# Patient Record
Sex: Male | Born: 1990 | Race: Black or African American | Hispanic: No | Marital: Single | Smoking: Never smoker
Health system: Southern US, Community
[De-identification: ages and names within clinical notes are randomized; demographics above are authoritative.]

## PROBLEM LIST (undated history)

## (undated) DIAGNOSIS — I499 Cardiac arrhythmia, unspecified: Secondary | ICD-10-CM

## (undated) DIAGNOSIS — T7840XA Allergy, unspecified, initial encounter: Secondary | ICD-10-CM

## (undated) HISTORY — DX: Allergy, unspecified, initial encounter: T78.40XA

---

## 2012-03-13 ENCOUNTER — Encounter (HOSPITAL_COMMUNITY): Payer: Self-pay | Admitting: *Deleted

## 2012-03-13 ENCOUNTER — Encounter (HOSPITAL_COMMUNITY): Payer: Self-pay

## 2012-03-13 ENCOUNTER — Emergency Department (HOSPITAL_COMMUNITY)
Admission: EM | Admit: 2012-03-13 | Discharge: 2012-03-13 | Disposition: A | Discharge status: Other Acute Inpatient Hospital | Payer: BC Managed Care – PPO | Source: Home / Self Care | Attending: Emergency Medicine | Admitting: Emergency Medicine

## 2012-03-13 ENCOUNTER — Emergency Department (HOSPITAL_COMMUNITY)
Admission: EM | Admit: 2012-03-13 | Discharge: 2012-03-13 | Disposition: A | Discharge status: Home / Self Care | Payer: BC Managed Care – PPO | Attending: Emergency Medicine | Admitting: Emergency Medicine

## 2012-03-13 ENCOUNTER — Emergency Department (INDEPENDENT_AMBULATORY_CARE_PROVIDER_SITE_OTHER): Payer: BC Managed Care – PPO

## 2012-03-13 DIAGNOSIS — R079 Chest pain, unspecified: Secondary | ICD-10-CM

## 2012-03-13 DIAGNOSIS — I4949 Other premature depolarization: Secondary | ICD-10-CM

## 2012-03-13 DIAGNOSIS — I493 Ventricular premature depolarization: Secondary | ICD-10-CM

## 2012-03-13 HISTORY — DX: Cardiac arrhythmia, unspecified: I49.9

## 2012-03-13 MED ORDER — SODIUM CHLORIDE 0.9 % IV SOLN
Freq: Once | INTRAVENOUS | Status: AC
  Administered 2012-03-13 (×2): via INTRAVENOUS

## 2012-03-13 NOTE — ED Provider Notes (Addendum)
History     CSN: [id number]  Arrival date & time 03/13/12  1619   First MD Initiated Contact with Patient 03/13/12 1620      Chief Complaint  Patient presents with  . Heartburn    (Consider location/radiation/quality/duration/timing/severity/associated sxs/prior treatment) HPI Comments: Patient presents urgent care this evening complaining of an ongoing left-sided chest pain that started last night. He describes pain is somewhat constant but exacerbates with movements. He also seems to describe some irregular heartbeats. Patient is squeezing type, comes and goes, denies any shortness of breath, cough, denies feeling nauseous. Describes that he vomited once. Patient also describes that yesterday evening he had some chicken, and thought that perhaps he was having some reflux. Denies any epigastric discomfort.  Patient is a 21 y.o. male presenting with heartburn. The history is provided by the patient.  Heartburn This is a new problem. The current episode started 12 to 24 hours ago. The problem occurs constantly. The problem has not changed since onset.Associated symptoms include chest pain. Pertinent negatives include no abdominal pain, no headaches and no shortness of breath. The symptoms are aggravated by walking. Nothing relieves the symptoms. Treatments tried: Tums. The treatment provided no relief.    Past Medical History  Diagnosis Date  . Irregular heart rate     History reviewed. No pertinent past surgical history.  Family History  Problem Relation Age of Onset  . Heart disease Brother     History  Substance Use Topics  . Smoking status: Never Smoker   . Smokeless tobacco: Not on file  . Alcohol Use: Yes     socially      Review of Systems  Constitutional: Negative for activity change and appetite change.  Respiratory: Negative for apnea, cough and shortness of breath.   Cardiovascular: Positive for chest pain. Negative for palpitations and leg swelling.    Gastrointestinal: Positive for heartburn. Negative for abdominal pain.  Skin: Negative for rash.  Neurological: Negative for dizziness, light-headedness, numbness and headaches.    Allergies  Review of patient's allergies indicates no known allergies.  Home Medications  No current outpatient prescriptions on file.  BP 124/53  Pulse 45  Temp 98.9 F (37.2 C) (Oral)  Resp 16  SpO2 100%  Physical Exam  Nursing note and vitals reviewed. Constitutional: He appears well-developed and well-nourished.  Neck: Neck supple. No JVD present.  Cardiovascular: Normal pulses.  An irregular rhythm present. Bradycardia present.  Exam reveals friction rub. Exam reveals no gallop.   No murmur heard. Pulmonary/Chest: Effort normal and breath sounds normal.  Neurological: He is alert.  Skin: No rash noted. No erythema.    ED Course  Procedures (including critical care time)  Labs Reviewed - No data to display Dg Chest 2 View  03/13/2012  *RADIOLOGY REPORT*  Clinical Data: Left-sided chest pain  CHEST - 2 VIEW  Comparison:  None.  Findings:  The heart size and mediastinal contours are within normal limits.  Both lungs are clear.  The visualized skeletal structures are unremarkable.  IMPRESSION: No active cardiopulmonary disease.  Original Report Authenticated By: Camelia Phenes, M.D.     No diagnosis found.  EKG at [facility] around 17 hours showed sinus bradycardia with occasional PVCs and incomplete right bundle branch block. No abnormal QT or PR intervals. No ST depression or elevation. No Q waves  MDM  Patient symptomatic describing left-sided chest pain for the last 24 hours. Patient has no respiratory symptoms and has a normal chest x-ray and  normal respiratory exam. Patient has some occasional PVCs and a incomplete right bundle blanch block. Given patient's current symptomatology and abnormal EKG have decided to transfer patient for further monitoring and evaluation in the emergency  department.        Jimmie Molly, MD 03/13/12 Flossie Buffy  Jimmie Molly, MD 03/13/12 2000

## 2012-03-13 NOTE — ED Notes (Signed)
Pt started having cp, last night after dinner continued to this morning, went to urgent care and then referred here.

## 2012-03-13 NOTE — ED Notes (Signed)
"   i have this heartburn on the left side of my chest since last night" pt denies radiating pain, shortness of breath or nausea. unrelived with "tums"

## 2012-03-13 NOTE — ED Notes (Signed)
Care transferred to [facility]. Mother at bedside.

## 2012-03-13 NOTE — ED Notes (Signed)
ekg given to MD per EDT

## 2012-03-13 NOTE — ED Notes (Signed)
Report called to charge nurse [facility]. [facility] called to transport.

## 2012-03-13 NOTE — ED Provider Notes (Signed)
History     CSN: [id number]  Arrival date & time 03/13/12  4098   First MD Initiated Contact with Patient 03/13/12 1904      Chief Complaint  Patient presents with  . Chest Pain    (Consider location/radiation/quality/duration/timing/severity/associated sxs/prior treatment) Patient is a 21 y.o. male presenting with chest pain. The history is provided by the patient and a parent.  Chest Pain The chest pain began yesterday. Chest pain occurs intermittently. The chest pain is unchanged. The pain is associated with lifting (twisting, sitting up). The severity of the pain is mild. Quality: cramping. The pain does not radiate. Exacerbated by: twisting, sitting up. Pertinent negatives for primary symptoms include no fever, no fatigue, no syncope, no shortness of breath, no cough, no wheezing, no palpitations, no abdominal pain, no nausea, no vomiting, no dizziness and no altered mental status.  Pertinent negatives for associated symptoms include no claudication, no lower extremity edema, no near-syncope, no orthopnea, no paroxysmal nocturnal dyspnea and no weakness. He tried nothing for the symptoms. Risk factors include no known risk factors.  His family medical history is significant for CAD in family.     Past Medical History  Diagnosis Date  . Irregular heart rate     History reviewed. No pertinent past surgical history.  Family History  Problem Relation Age of Onset  . Heart disease Brother     History  Substance Use Topics  . Smoking status: Never Smoker   . Smokeless tobacco: Not on file  . Alcohol Use: Yes     socially      Review of Systems  Constitutional: Negative for fever and fatigue.  HENT: Negative.   Eyes: Negative.   Respiratory: Negative for cough, chest tightness, shortness of breath and wheezing.   Cardiovascular: Positive for chest pain. Negative for palpitations, orthopnea, claudication, leg swelling, syncope and near-syncope.  Gastrointestinal:  Negative.  Negative for nausea, vomiting and abdominal pain.  Genitourinary: Negative.   Musculoskeletal: Negative.   Skin: Negative.   Neurological: Negative for dizziness, syncope, facial asymmetry, weakness and light-headedness.  Psychiatric/Behavioral: Negative for altered mental status.  All other systems reviewed and are negative.    Allergies  Review of patient's allergies indicates no known allergies.  Home Medications  No current outpatient prescriptions on file.  BP 103/67  Pulse 61  Temp 98.6 F (37 C) (Oral)  Resp 18  Ht 6\' 3"  (1.905 m)  Wt 200 lb (90.719 kg)  BMI 25.00 kg/m2  SpO2 100%  Physical Exam  Nursing note and vitals reviewed. Constitutional: He is oriented to person, place, and time. He appears well-developed and well-nourished. No distress.  HENT:  Head: Normocephalic and atraumatic.  Eyes: Conjunctivae are normal.  Neck: Neck supple.  Cardiovascular: Normal rate, regular rhythm, normal heart sounds and intact distal pulses.  Exam reveals no friction rub.   No murmur heard. Pulmonary/Chest: Effort normal and breath sounds normal. He has no wheezes. He has no rales.  Abdominal: Soft. He exhibits no distension. There is no tenderness.  Musculoskeletal: Normal range of motion.  Neurological: He is alert and oriented to person, place, and time.  Skin: Skin is warm and dry.    ED Course  Procedures (including critical care time)  Labs Reviewed - No data to display Dg Chest 2 View  03/13/2012  *RADIOLOGY REPORT*  Clinical Data: Left-sided chest pain  CHEST - 2 VIEW  Comparison:  None.  Findings:  The heart size and mediastinal contours are within normal  limits.  Both lungs are clear.  The visualized skeletal structures are unremarkable.  IMPRESSION: No active cardiopulmonary disease.  Original Report Authenticated By: Camelia Phenes, M.D.     1. Chest pain       MDM  21 yo male with no PMHx who presents with intermittent cramping left lower  chest pain that started yesterday.  Pt was seen at urgent care today where he had and EKG and CXR and instructed to come to the ED.  Description of chest pain atypical.  Pain made worse with twisting and sitting up from a lying position.  No exertional chest pain.  No shortness of breath, pleuritic component, orthopnea, or PND.  PERC negative and I have very low suspicion for PE.  AF, nml vital signs, NAD.  Physical exam with normal cardiac and pulmonary exams.  No murmurs or rubs, pain not worse with lying flat.  Doubt pericarditis.  EKG NSR w/o signs of arrhythmia, ischemia, or infarction.  CXR reviewed and showed no acute abnormality.  Pt pain free at time of exam.  No signs of ACS.  Will DC home with outpatient cardiology referral.  Tx plan discussed with pt and parent who voiced understanding.  Strong return precautions provided.        Cherre Robins, MD 03/13/12 2326

## 2012-03-14 ENCOUNTER — Telehealth (HOSPITAL_COMMUNITY): Payer: Self-pay | Admitting: *Deleted

## 2012-03-14 DIAGNOSIS — R079 Chest pain, unspecified: Secondary | ICD-10-CM

## 2012-03-14 DIAGNOSIS — R0602 Shortness of breath: Secondary | ICD-10-CM

## 2012-03-14 NOTE — Telephone Encounter (Signed)
Pt scheduled for thur 8/15 2 pm echo and OV at 3, his mother is aware

## 2012-03-14 NOTE — ED Provider Notes (Signed)
I saw and evaluated the patient, reviewed Dr. Daleen Bo note and I agree with the findings and plan.  The patient presents complaining of chest discomfort since yesterday.  This started after he sat up suddenly.  The pain is sharp in nature and is worse with deep breath and position.  There is no shortness of breath and no exertional component.  He is quite athletic and works out regularly without symptoms.  He was seen at urgent care and told to come here to be looked at further.  There was the question of a rub to auscultation of the heart.  On exam, the heart is rrr without murmurs or rubs.  The lungs are clear.  The abdomen is soft and non-tender.  The ekg and chest xray are unremarkable.  I am unable to appreciate a rub or other abnormalities and the symptoms are consistent with a musculoskeletal etiology.  After discussion with the mother and patient, the patient will be discharged with follow up with cardiology to discuss an echocardiogram.  He will return if his symptoms worsen or change.    Geoffery Lyons, MD 03/14/12 [phone]

## 2012-03-14 NOTE — Telephone Encounter (Signed)
Per Dr Gala Romney pt was seen in ER yesterday with CP and had changes on EKG, needs to be seen this week with an echo.  Order placed and will schedule and contact pt's mother for appt

## 2012-03-16 ENCOUNTER — Ambulatory Visit (HOSPITAL_BASED_OUTPATIENT_CLINIC_OR_DEPARTMENT_OTHER)
Admission: RE | Admit: 2012-03-16 | Discharge: 2012-03-16 | Disposition: A | Discharge status: Home / Self Care | Payer: BC Managed Care – PPO | Source: Ambulatory Visit | Attending: Internal Medicine | Admitting: Internal Medicine

## 2012-03-16 ENCOUNTER — Ambulatory Visit (HOSPITAL_COMMUNITY)
Admission: RE | Admit: 2012-03-16 | Discharge: 2012-03-16 | Disposition: A | Discharge status: Home / Self Care | Payer: BC Managed Care – PPO | Source: Ambulatory Visit | Attending: Internal Medicine | Admitting: Internal Medicine

## 2012-03-16 ENCOUNTER — Encounter (HOSPITAL_COMMUNITY): Payer: Self-pay

## 2012-03-16 VITALS — BP 116/78 | HR 78 | Ht 75.0 in | Wt 180.0 lb

## 2012-03-16 DIAGNOSIS — R079 Chest pain, unspecified: Secondary | ICD-10-CM | POA: Insufficient documentation

## 2012-03-16 DIAGNOSIS — R0789 Other chest pain: Secondary | ICD-10-CM

## 2012-03-16 DIAGNOSIS — R072 Precordial pain: Secondary | ICD-10-CM

## 2012-03-16 DIAGNOSIS — I379 Nonrheumatic pulmonary valve disorder, unspecified: Secondary | ICD-10-CM | POA: Insufficient documentation

## 2012-03-16 NOTE — Progress Notes (Signed)
*  PRELIMINARY RESULTS* Echocardiogram 2D Echocardiogram has been performed.  Jeryl Columbia 03/16/2012, 2:42 PM

## 2012-03-16 NOTE — Progress Notes (Signed)
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 Hillary Bow for evaluation of CP.   He has a h/o migraines but no other significant PMHx. Was very active in school playing football, basketball and other sports. When he was playing basketball had some dizziness. Saw Dr. Rosiland Oz [facility] ped cardiology (EP) in 2010. Did an echo and wore a Holter and did a stress test. Everything was normal except for some PVCs.   Continues to go to gym and run without any problems.   On Monday night was eating chicken and got severe crampy pain. No diaphoresis, n/v. Tried to sleep it off but when he woke up pain was there. Worse when moving or lifting arms up to take shirt off. Went to UC. ECG showed sinus brady 59. +prominent volts. No ST-T wave abnormalities. No bloodwork drawn. ER doc though he may have heard a rub.   Echo today which I reviewed. EF 50-55%.Normal thickness. Normal RV. Valves normal. No effusion.    Review of Systems:     Cardiac Review of Systems: {Y] = yes [ ]  = no  Chest Pain [  y  ]  Resting SOB [   ] Exertional SOB  [  ]  Orthopnea [  ]   Pedal Edema [   ]    Palpitations [  ] Syncope  [  ]   Presyncope [   ]  General Review of Systems: [Y] = yes [  ]=no Constitional: recent weight change [  ]; anorexia [  ]; fatigue [  ]; nausea [  ]; night sweats [  ]; fever [  ]; or chills [  ];                                                                                                                                          Dental: poor dentition[  ]  Eye : blurred vision [  ]; diplopia [   ]; vision changes [  ];  Amaurosis fugax[  ]; Resp: cough [  ];  wheezing[  ];  hemoptysis[  ]; shortness of breath[  ]; paroxysmal nocturnal dyspnea[  ]; dyspnea on exertion[  ]; or orthopnea[  ];  GI:  gallstones[  ], vomiting[  ];  dysphagia[  ]; melena[  ];  hematochezia [  ]; heartburn[  ];   Hx of  Colonoscopy[  ]; GU: kidney stones [  ]; hematuria[  ];   dysuria [   ];  nocturia[  ];  history of     obstruction [  ];                 Skin: rash, swelling[  ];, hair loss[  ];  peripheral edema[  ];  or itching[  ]; Musculosketetal: myalgias[  ];  joint swelling[  ];  joint erythema[  ];  joint pain[  ];  back pain[  ];  Heme/Lymph: bruising[  ];  bleeding[  ];  anemia[  ];  Neuro: TIA[  ];  headaches[  ];  stroke[  ];  vertigo[  ];  seizures[  ];   paresthesias[  ];  difficulty walking[  ];  Psych:depression[  ]; anxiety[  ];  Endocrine: diabetes[  ];  thyroid dysfunction[  ];    Past Medical History  Diagnosis Date  . Irregular heart rate     No current outpatient prescriptions on file.     No Known Allergies  History   Social History  . Marital Status: Single    Spouse Name: N/A    Number of Children: N/A  . Years of Education: N/A   Occupational History  . Not on file.   Social History Main Topics  . Smoking status: Never Smoker   . Smokeless tobacco: Not on file  . Alcohol Use: Yes     socially  . Drug Use: No  . Sexually Active: No   Other Topics Concern  . Not on file   Social History Narrative  . No narrative on file    Family History  Problem Relation Age of Onset  . Heart disease Brother     PHYSICAL EXAM: Filed Vitals:   03/16/12 1515  BP: 116/78  Pulse: 78   General:  Well appearing, muscular. No respiratory difficulty HEENT: normal Neck: supple. no JVD. Carotids 2+ bilat; no bruits. No lymphadenopathy or thryomegaly appreciated. Cor: PMI nondisplaced. Regular rate & rhythm. No rubs, gallops or murmurs. Tender to palpation. Lungs: clear Abdomen: soft, nontender, nondistended. No hepatosplenomegaly. No bruits or masses. Good bowel sounds. Extremities: no cyanosis, clubbing, rash, edema Neuro: alert & oriented x 3, cranial nerves grossly intact. moves all 4 extremities w/o difficulty. Affect pleasant.  ECG: sinus brady 59. +prominent volts. No ST-T wave abnormalities.    ASSESSMENT & PLAN:

## 2012-04-01 DIAGNOSIS — R0789 Other chest pain: Secondary | ICD-10-CM | POA: Insufficient documentation

## 2012-04-01 NOTE — Assessment & Plan Note (Addendum)
Symptoms quite atypical. Exercise capacity preserved. CP likely musculoskeletal. Echo reviewed personally. I reassured Stephenson and his mother that I thought his CP was musculoskeletal and no further work-up needed at this point. Can f/u with me as needed. Contact me ASAP if CP recurs or develops exertional symptoms.

## 2016-03-26 ENCOUNTER — Other Ambulatory Visit: Payer: Self-pay | Admitting: Family

## 2016-03-26 DIAGNOSIS — R2242 Localized swelling, mass and lump, left lower limb: Secondary | ICD-10-CM

## 2016-04-04 ENCOUNTER — Other Ambulatory Visit: Payer: Self-pay

## 2016-05-26 ENCOUNTER — Ambulatory Visit: Payer: Self-pay | Admitting: Neurology

## 2016-10-06 ENCOUNTER — Ambulatory Visit (INDEPENDENT_AMBULATORY_CARE_PROVIDER_SITE_OTHER): Payer: Commercial Managed Care - PPO | Admitting: Orthopedic Surgery

## 2016-10-06 ENCOUNTER — Encounter (INDEPENDENT_AMBULATORY_CARE_PROVIDER_SITE_OTHER): Payer: Self-pay | Admitting: Orthopedic Surgery

## 2016-10-06 DIAGNOSIS — M25552 Pain in left hip: Secondary | ICD-10-CM | POA: Diagnosis not present

## 2016-10-06 MED ORDER — DICLOFENAC SODIUM 75 MG PO TBEC: DELAYED_RELEASE_TABLET | ORAL | 1 refills | Status: DC

## 2016-10-06 NOTE — Progress Notes (Signed)
Office Visit Note   Patient: Michael Bennett           Date of Birth: Jul 27, 1991           MRN: [id number] Visit Date: 10/06/2016 Requested by: No referring provider defined for this encounter. PCP: Default, Provider, MD  Subjective: Chief Complaint  Patient presents with  . 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  He has had radiographs done on 2 occasions of the hip which were interpreted as being normal.  He's had some adjustments by Dr. Manson Passey which have helped to relieve some of the malalignment in his hip as well as some of the numbness that he had in his leg but it is not gone and he reports continued symptoms.  He localizes a constant pain to the groin anteriorly on the left-hand side.  Pain comes and goes he reports stiffness and tightness in this area.  The groin pain doesn't really go away.  This all started a year ago with some type of leg press hyperflexion injury of the hip joint.  He has not had a course of sustained anti-inflammatories yet for the problem.              ROS: All systems reviewed are negative as they relate to the chief complaint within the history of present illness.  Patient denies  fevers or chills.   Assessment & Plan: Visit Diagnoses:  1. Left hip pain     Plan: Impression is left hip pain of unclear etiology.  I discussed with him the several different possibilities that it could be.  One would be an occult arthritis which could account for some of the stiffness he is having.  Alternatively he may have early femoroacetabular impingement but that should've been noticed on the 2 sets of plain radiographs which I do not have access to.  Alternatively this could be a labral tear of the hip.  A hernia is also in the differential diagnosis but again unlikely based on his recent weightlifting set [place] done with squats and lunges.  I think it's  also possible this could be referred pain from a disc in his back although his exam today is pretty normal.  Plan at this time is to put him on a tapered dose of an anti-inflammatory for 4 weeks.  See him back in 6 weeks and then decide then for or against a left hip injection  Follow-Up Instructions: Return in about 6 weeks (around 11/17/2016).   Orders:  No orders of the defined types were placed in this encounter.  No orders of the defined types were placed in this encounter.     Procedures: No procedures performed   Clinical Data: No additional findings.  Objective: Vital Signs: There were no vitals taken for this visit.  Physical Exam:   Constitutional: Patient appears well-developed HEENT:  Head: Normocephalic Eyes:EOM are normal Neck: Normal range of motion Cardiovascular: Normal rate Pulmonary/chest: Effort normal Neurologic: Patient is alert Skin: Skin is warm Psychiatric: Patient has normal mood and affect    Ortho Exam: Orthopedic exam demonstrates normal gait and alignment excellent muscle tone in the legs no atrophy noted no nerve retention signs on either side.  No real groin pain with internal/external rotation of either leg excellent abduction and adduction and hip flexion quite hamstring strength.  I will detect any  clicking or popping with provocative labral testing no tenderness over the anterior superior iliac crest  Specialty Comments:  No specialty comments available.  Imaging: No results found.   PMFS History: Patient Active Problem List   Diagnosis Date Noted  . Left hip pain 10/06/2016  . Chest pain radiating to arm 04/01/2012   Past Medical History:  Diagnosis Date  . Irregular heart rate     Family History  Problem Relation Age of Onset  . Heart disease Brother     No past surgical history on file. Social History   Occupational History  . Not on file.   Social History Main Topics  . Smoking status: Never Smoker  . Smokeless  tobacco: Never Used  . Alcohol use Yes     Comment: socially  . Drug use: No  . Sexual activity: No

## 2016-11-17 ENCOUNTER — Ambulatory Visit (INDEPENDENT_AMBULATORY_CARE_PROVIDER_SITE_OTHER): Payer: Commercial Managed Care - PPO | Admitting: Orthopedic Surgery

## 2016-12-08 ENCOUNTER — Telehealth (INDEPENDENT_AMBULATORY_CARE_PROVIDER_SITE_OTHER): Payer: Self-pay | Admitting: Orthopedic Surgery

## 2016-12-08 DIAGNOSIS — M25559 Pain in unspecified hip: Secondary | ICD-10-CM

## 2016-12-08 NOTE — Telephone Encounter (Signed)
Mri ordered patient will be contacted with appt information and out of pocket cost

## 2016-12-08 NOTE — Telephone Encounter (Signed)
Patient called asked if Dr August Saucerean can get him set up to have an MRI. Patient want to be referred to a facility that have the lowest cost (out of pocket expense). The number to contact patient is [phone]

## 2017-03-14 ENCOUNTER — Ambulatory Visit (INDEPENDENT_AMBULATORY_CARE_PROVIDER_SITE_OTHER): Payer: Commercial Managed Care - PPO | Admitting: Urgent Care

## 2017-03-14 ENCOUNTER — Encounter: Payer: Self-pay | Admitting: Urgent Care

## 2017-03-14 VITALS — BP 134/73 | HR 57 | Temp 97.9°F | Resp 18 | Ht 73.0 in | Wt 205.6 lb

## 2017-03-14 DIAGNOSIS — G8929 Other chronic pain: Secondary | ICD-10-CM | POA: Diagnosis not present

## 2017-03-14 DIAGNOSIS — M62830 Muscle spasm of back: Secondary | ICD-10-CM

## 2017-03-14 DIAGNOSIS — M5442 Lumbago with sciatica, left side: Secondary | ICD-10-CM | POA: Diagnosis not present

## 2017-03-14 DIAGNOSIS — M25552 Pain in left hip: Secondary | ICD-10-CM

## 2017-03-14 MED ORDER — CELECOXIB 100 MG PO CAPS: 100.0000 mg | ORAL_CAPSULE | Freq: Two times a day (BID) | ORAL | 1 refills | Status: AC

## 2017-03-14 MED ORDER — CYCLOBENZAPRINE HCL 5 MG PO TABS: 5.0000 mg | ORAL_TABLET | Freq: Three times a day (TID) | ORAL | 1 refills | Status: AC | PRN

## 2017-03-14 NOTE — Progress Notes (Signed)
MRN: [id number] DOB: Apr 24, 1991  Subjective:   Michael Bennett is a 26 y.o. male presenting for chief complaint of Back Pain  Reports several month history of persistent low-left sided back pain. Pain is intermittent, generally achy but can also throb, feels like a soreness when he walks, can radiate down into his left leg, gets a tingling sensation in his lower left leg. His symptoms actually started when he was doing a leg press, he did not secure the machine well and the weight came down on his legs as he was trying to stand up, has had pain since then. He initially saw an orthopedist, had 3 visits, was advised to perform stretching exercises but did not have any relief with this. He also tried 2 week course of NSAID. He subsequently, saw a chiropractor, had 11 adjustments but stopped going because he started having left hip tenderness. Has been icing, performing conservative management with stretching, has not been working out his low back and legs in the gym anymore. Lastly, he was referred to another orthopedist by the chiropractor, was started on a steroid course which did not help. Denies fever, falls, trauma, history of back surgery, incontinence, weakness, numbness. Denies smoking cigarettes.  Michael Bennett is not currently taking any medications. Also has No Known Allergies.  Even  has a past medical history of Allergy and Irregular heart rate. Denies surgical history.   Objective:   Vitals: BP 134/73   Pulse (!) 57   Temp 97.9 F (36.6 C) (Oral)   Resp 18   Ht 6\' 1"  (1.854 m)   Wt 205 lb 9.6 oz (93.3 kg)   SpO2 99%   BMI 27.13 kg/m   Physical Exam  Constitutional: He is oriented to person, place, and time. He appears well-developed and well-nourished.  Cardiovascular: Normal rate.   Pulmonary/Chest: Effort normal.  Abdominal: Soft. Bowel sounds are normal. He exhibits no distension and no mass. There is no tenderness. There is no guarding.  Musculoskeletal:       Right hip: He  exhibits normal range of motion, normal strength, no tenderness, no bony tenderness, no swelling and no crepitus.       Left hip: He exhibits normal range of motion, normal strength, no tenderness, no bony tenderness, no swelling and no crepitus.       Lumbar back: He exhibits spasm (prominent spasm over area depicted). He exhibits normal range of motion, no tenderness (negative ipsilateral and contralateral SLR), no bony tenderness, no swelling, no edema and no deformity.       Back:  Neurological: He is alert and oriented to person, place, and time. He displays normal reflexes. Coordination normal.  Skin: Skin is warm and dry.  Psychiatric: He has a normal mood and affect.   Assessment and Plan :   1. Chronic left-sided low back pain with left-sided sciatica 2. Left hip pain 3. Spasm of muscle of lower back - Patient needs an MRI of his low back. I offered referral to [facility] as he may need PT and/or back injections. In the meantime, I recommended patient continue to perform back stretching within his pain tolerance. He has not tried muscle relaxant so we will add that on. I will also have patient use Celebrex as an NSAID so as to not interfere with steroid injections should he need those at [facility]. I will place an order for an MRI as well. Patient can cancel this if he ends up deciding to follow a  different treatment plan with [facility].  Wallis BambergMario Mykel Mohl, PA-C [facility] [phone] 03/14/2017  5:38 PM

## 2017-03-14 NOTE — Patient Instructions (Addendum)
Sciatica Sciatica is pain, numbness, weakness, or tingling along the path of the sciatic nerve. The sciatic nerve starts in the lower back and runs down the back of each leg. The nerve controls the muscles in the lower leg and in the back of the knee. It also provides feeling (sensation) to the back of the thigh, the lower leg, and the sole of the foot. Sciatica is a symptom of another medical condition that pinches or puts pressure on the sciatic nerve. Generally, sciatica only affects one side of the body. Sciatica usually goes away on its own or with treatment. In some cases, sciatica may keep coming back (recur). What are the causes? This condition is caused by pressure on the sciatic nerve, or pinching of the sciatic nerve. This may be the result of:  A disk in between the bones of the spine (vertebrae) bulging out too far (herniated disk).  Age-related changes in the spinal disks (degenerative disk disease).  A pain disorder that affects a muscle in the buttock (piriformis syndrome).  Extra bone growth (bone spur) near the sciatic nerve.  An injury or break (fracture) of the pelvis.  Pregnancy.  Tumor (rare). What increases the risk? The following factors may make you more likely to develop this condition:  Playing sports that place pressure or stress on the spine, such as football or weight lifting.  Having poor strength and flexibility.  A history of back injury.  A history of back surgery.  Sitting for long periods of time.  Doing activities that involve repetitive bending or lifting.  Obesity. What are the signs or symptoms? Symptoms can vary from mild to very severe, and they may include:  Any of these problems in the lower back, leg, hip, or buttock:  Mild tingling or dull aches.  Burning sensations.  Sharp pains.  Numbness in the back of the calf or the sole of the foot.  Leg weakness.  Severe back pain that makes movement difficult. These symptoms may  get worse when you cough, sneeze, or laugh, or when you sit or stand for long periods of time. Being overweight may also make symptoms worse. In some cases, symptoms may recur over time. How is this diagnosed? This condition may be diagnosed based on:  Your symptoms.  A physical exam. Your health care provider may ask you to do certain movements to check whether those movements trigger your symptoms.  You may have tests, including:  Blood tests.  X-rays.  MRI.  CT scan. How is this treated? In many cases, this condition improves on its own, without any treatment. However, treatment may include:  Reducing or modifying physical activity during periods of pain.  Exercising and stretching to strengthen your abdomen and improve the flexibility of your spine.  Icing and applying heat to the affected area.  Medicines that help:  To relieve pain and swelling.  To relax your muscles.  Injections of medicines that help to relieve pain, irritation, and inflammation around the sciatic nerve (steroids).  Surgery. Follow these instructions at home: Medicines   Take over-the-counter and prescription medicines only as told by your health care provider.  Do not drive or operate heavy machinery while taking prescription pain medicine. Managing pain   If directed, apply ice to the affected area.  Put ice in a plastic bag.  Place a towel between your skin and the bag.  Leave the ice on for 20 minutes, 2-3 times a day.  After icing, apply heat to the   affected area before you exercise or as often as told by your health care provider. Use the heat source that your health care provider recommends, such as a moist heat pack or a heating pad.  Place a towel between your skin and the heat source.  Leave the heat on for 20-30 minutes.  Remove the heat if your skin turns bright red. This is especially important if you are unable to feel pain, heat, or cold. You may have a greater risk of  getting burned. Activity   Return to your normal activities as told by your health care provider. Ask your health care provider what activities are safe for you.  Avoid activities that make your symptoms worse.  Take brief periods of rest throughout the day. Resting in a lying or standing position is usually better than sitting to rest.  When you rest for longer periods, mix in some mild activity or stretching between periods of rest. This will help to prevent stiffness and pain.  Avoid sitting for long periods of time without moving. Get up and move around at least one time each hour.  Exercise and stretch regularly, as told by your health care provider.  Do not lift anything that is heavier than 10 lb (4.5 kg) while you have symptoms of sciatica. When you do not have symptoms, you should still avoid heavy lifting, especially repetitive heavy lifting.  When you lift objects, always use proper lifting technique, which includes:  Bending your knees.  Keeping the load close to your body.  Avoiding twisting. General instructions   Use good posture.  Avoid leaning forward while sitting.  Avoid hunching over while standing.  Maintain a healthy weight. Excess weight puts extra stress on your back and makes it difficult to maintain good posture.  Wear supportive, comfortable shoes. Avoid wearing high heels.  Avoid sleeping on a mattress that is too soft or too hard. A mattress that is firm enough to support your back when you sleep may help to reduce your pain.  Keep all follow-up visits as told by your health care provider. This is important. Contact a health care provider if:  You have pain that wakes you up when you are sleeping.  You have pain that gets worse when you lie down.  Your pain is worse than you have experienced in the past.  Your pain lasts longer than 4 weeks.  You experience unexplained weight loss. Get help right away if:  You lose control of your bowel  or bladder (incontinence).  You have:  Weakness in your lower back, pelvis, buttocks, or legs that gets worse.  Redness or swelling of your back.  A burning sensation when you urinate. This information is not intended to replace advice given to you by your health care provider. Make sure you discuss any questions you have with your health care provider. Document Released: 07/13/2001 Document Revised: 12/23/2015 Document Reviewed: 03/28/2015 Elsevier Interactive Patient Education  2017 [employer]     IF you received an x-ray today, you will receive an invoice from [facility]. Please contact [facility] at [phone] with questions or concerns regarding your invoice.   IF you received labwork today, you will receive an invoice from [facility]. Please contact [facility] at [phone] with questions or concerns regarding your invoice.   Our billing staff will not be able to assist you with questions regarding bills from these companies.  You will be contacted with the lab results as soon as they are available.   available. The fastest way to get your results is to activate your My Chart account. Instructions are located on the last page of this paperwork. If you have not heard from us regarding the results in 2 weeks, please contact this office.

## 2017-04-01 ENCOUNTER — Ambulatory Visit (INDEPENDENT_AMBULATORY_CARE_PROVIDER_SITE_OTHER): Payer: Self-pay | Admitting: Family Medicine

## 2017-04-01 VITALS — BP 124/80 | Ht 74.0 in | Wt 205.0 lb

## 2017-04-01 DIAGNOSIS — M545 Low back pain, unspecified: Secondary | ICD-10-CM

## 2017-04-01 DIAGNOSIS — M25552 Pain in left hip: Secondary | ICD-10-CM

## 2017-04-01 NOTE — Patient Instructions (Signed)
Be sure to keep up with your home exercises. Return to clinic in 2-3 weeks for follow up. Call with any questions.

## 2017-04-01 NOTE — Progress Notes (Signed)
Chief complaint: Lower back pain and left anterior hip pain 12 months  History of present illness: Michael Bennett is a 26 year old male who presents to the sports medicine office today for initial violation of low back pain as well as left anterior hip pain. He reports that symptoms have been present for approximately 12 months. He reports initial incident occurred 12 months ago, he was doing leg presses, reports that he had thought that he locked the weight, was pulling himself up in the weight actually fell on his left leg, causing a hyperflexion injury. He reports having left knee pain and left hip pain. Describes pain as a throbbing and aching pain, with no radiation of pain. Shortly after that time frame he started to develop lower back pain, specifically on his left side. He saw a chiropractor, reports that he had a chiropractor appointment approximately 10-11 times, reports that he had his hips reset. He reports with no interval improvement in symptoms he did see Dr. Althea Charon at [facility] back in the fall of last year. Hip x-rays were done with air, he reports that he was told that there were no hip abnormalities, he does need to do some physical therapy and stretching. He reports that he did his own exercises, with no interval improvement in symptoms. He reports that he went to another orthopedic office at [facility], saw Dr. August Saucer back in March 2018,hip x-rays were done which did not show any abnormality. He was also instructed to do physical therapy, which she reports that he did on his own and did not help. He did have Medrol Dosepak, reports that this did not help him as well. He reports of the next step he was told that he would need to have a hip injection and would also need a MRI. It was not fully certain the actual cause of his pain.  Today, he reports a dull, throbbing pain in his left anterior hip. He reports any type of hip flexion and external range of motion causes a tightness pain. He also  reports of left lower back pain. He initially had symptoms of sciatica when symptoms first started, but this went away. He is not report of any numbness, tingling, or weakness in his left lower extremity. He does not report of any bowel or bladder incontinence, does not report of any saddle anesthesia. Last month, he did see urgent care, was prescribed Celebrex, however, he did not pick it up because of cost prohibitive reasons. He did take Flexeril for a week, but stopped after side effects of headaches. He reports over the last few months just doing upper body weights, has not been doing much of lower body weights due to the pain.  Past medical history, surgical history, family history, and social history reviewed, no pertinent past medical history, no surgeries involving the lower back, hip, or lower extremities, no pertinent family history, no tobacco use.  Allergies: No known drug allergies  Review of systems: As stated above  Physical exam: Vital signs reviewed and are documented in chart General: Alert, oriented, appears stated age, in no apparent distress HEENT: Moist oral mucosa Respiratory: Normal respirations, able to speak in full sentences Cardiac: Normal peripheral pulses, regular rate Integumentary: No rashes on visible skin Neurologic: He does have weakness with hip abductors on the left side, would categorize this as 4+/5, otherwise strength 5/5 in bilateral lower extremities, sensation 2+ in bilateral lower extremities Psychiatric: Appropriate affect and mood Musculoskeletal: Inspection of left hip reveals no obvious deformity  or muscle atrophy, no warmth, erythema, ecchymosis, or effusion noted, he does have vague pain along the anterior hip near the inguinal ligament, pain elicited with hip flexion and hip external range of motion on left side, straight leg negative, FADER positive, FADIR negative, stork test negative, when had patient flexes back bending over he does have  slight asymmetry with slight elevation of his left side compared to his right side, no tenderness along the lateral hip and posterior hip and left side  Limited musculoskeletal ultrasound: Limited musculoskeletal ultrasound of the left hip was performed in the office today. No abnormalities seen with any of the hip abductors are hip abductor muscles, no hypoechoic changes seen. No cortical irregularity seen along the hip acetabular rim and head of femur  Impression: Unremarkable ultrasound of left hip  Assessment and plan: 1. Left anterior hip pain, most likey due to hip flexor and hip abductor weakness 2. Low back pain, suspect ligamentous strain 3. ? Scoliosis  Left anterior hip pain -Discussed with patient that symptoms seem to be more consistent with hip flexor and hip abductor weakness, though cannot rule out possibility of femoroacetabular impingement or hip labral tear, symptoms don't seem to be consistent with femoral neck stress fracture, no lateral hip pain to suggest greater trochanter bursitis, no posterior hip pain to suggest hip abductor tendinopathy or gluteus medius syndrome, in absence of trauma and with his age should be unusual to develop osteoarthritis, though this is not out of the question -HEP was given, this will also help with his back pain, as symptoms are most likely inter-related  He is instructed to return to the clinic in 2-3 weeks for follow-up or sooner as needed. If symptoms do not improve, would consider repeat imaging and MRI.   Haynes Kernshristopher Lake, MD Primary Care Sports Medicine Fellow [facility]

## 2017-04-05 NOTE — Progress Notes (Addendum)
[facility] Att[facility]: I have reviewed the chart, discussed wit the Sports Medicine Fellow. I agree with assessment and treatment plan as detailed in the Fellow's note. I performed ultrasound as well. Hip joint looks normal with a very mild suspicion of acetabular impringement noted in forward flexion but not ABducted flexion.  I agree with HEP and f/u.

## 2017-04-05 NOTE — Addendum Note (Signed)
Addended byDenny Levy: Mava Suares L on: 04/05/2017 09:09 AM   Modules accepted: Level of Service

## 2017-04-22 ENCOUNTER — Ambulatory Visit: Payer: Self-pay | Admitting: Family Medicine

## 2017-08-18 ENCOUNTER — Telehealth (INDEPENDENT_AMBULATORY_CARE_PROVIDER_SITE_OTHER): Payer: Self-pay | Admitting: Orthopedic Surgery

## 2017-08-18 NOTE — Telephone Encounter (Signed)
Please advise. Not likely insurance will cover without recent OV

## 2017-08-18 NOTE — Telephone Encounter (Signed)
Patient wants an MRI ordered of his hip, he hasnt been seen since March so I told him I didn't know if you would be able to. Please advise if you need to see him in office first before ordering the MRI. CB # [phone]

## 2017-08-19 NOTE — Telephone Encounter (Signed)
IC appt scheduled.  

## 2017-08-19 NOTE — Telephone Encounter (Signed)
Need ov pls clla thx

## 2017-08-29 ENCOUNTER — Encounter (INDEPENDENT_AMBULATORY_CARE_PROVIDER_SITE_OTHER): Payer: Self-pay | Admitting: Orthopedic Surgery

## 2017-08-29 ENCOUNTER — Ambulatory Visit (INDEPENDENT_AMBULATORY_CARE_PROVIDER_SITE_OTHER): Payer: Commercial Managed Care - PPO | Admitting: Orthopedic Surgery

## 2017-08-29 DIAGNOSIS — M5442 Lumbago with sciatica, left side: Secondary | ICD-10-CM | POA: Diagnosis not present

## 2017-08-29 DIAGNOSIS — G8929 Other chronic pain: Secondary | ICD-10-CM

## 2017-08-31 ENCOUNTER — Encounter (INDEPENDENT_AMBULATORY_CARE_PROVIDER_SITE_OTHER): Payer: Self-pay | Admitting: Orthopedic Surgery

## 2017-08-31 NOTE — Progress Notes (Signed)
Office Visit Note   Patient: Michael Bennett           Date of Birth: 03-15-1991           MRN: [id number] Visit Date: 08/29/2017 Requested by: No referring provider defined for this encounter. PCP: Patient, No Pcp Per  Subjective: Chief Complaint  Patient presents with  . Left Hip - Follow-up, Pain  . Lower Back - Follow-up, Pain    HPI: Patient presents for evaluation of low back pain and left hip pain.  Describes radiating pain and numbness and tingling down to the left foot along with left-sided low back pain.  He reports difficulties with squats.  The patient has a very large commitment to fitness.  He has been reading on the Internet and thinks that this may be related to anterior pelvic tilt.  Medications in the past have helped his left hip.  Denies any discrete mechanical symptoms in the groin on the left-hand side.  He reports mild back pain.  This is been going on now for several months.              ROS: All systems reviewed are negative as they relate to the chief complaint within the history of present illness.  Patient denies  fevers or chills.   Assessment & Plan: Visit Diagnoses:  1. Chronic midline low back pain with left-sided sciatica     Plan: Impression is low back pain with fairly classic radiculopathy going down the left leg.  Is much as he is lifting weights I think that this likely represents bulging disc problem.  Plan MRI scan to evaluate left-sided radiculopathy with likely ESI to follow.  He has pretty reasonable strength lacks ability.  Not sure if therapy has anything to offer at this time but I would get the scan first to try to calm down the symptoms with an injection and then consider therapy for core strengthening and stretching if indicated.  Follow-Up Instructions: Return for after MRI.   Orders:  Orders Placed This Encounter  Procedures  . MR Lumbar Spine w/o contrast   No orders of the defined types were placed in this encounter.     Procedures: No procedures performed   Clinical Data: No additional findings.  Objective: Vital Signs: There were no vitals taken for this visit.  Physical Exam:   Constitutional: Patient appears well-developed HEENT:  Head: Normocephalic Eyes:EOM are normal Neck: Normal range of motion Cardiovascular: Normal rate Pulmonary/chest: Effort normal Neurologic: Patient is alert Skin: Skin is warm Psychiatric: Patient has normal mood and affect    Ortho Exam: No nerve root tension signs.  Does have paresthesias L5 distribution left compared to right.  Ankle dorsiflexion plantarflexion quad hamstring strength 5+ out of 5 bilaterally.  Pedal pulses palpable.  No other masses lymphadenopathy or skin changes noted in the left leg region.  Gait is normal.  Leg length normal.  Specialty Comments:  No specialty comments available.  Imaging: No results found.   PMFS History: Patient Active Problem List   Diagnosis Date Noted  . Left hip pain 10/06/2016  . Chest pain radiating to arm 04/01/2012   Past Medical History:  Diagnosis Date  . Allergy   . Irregular heart rate     Family History  Problem Relation Age of Onset  . Heart disease Brother     History reviewed. No pertinent surgical history. Social History   Occupational History  . Not on file  Tobacco Use  .  Smoking status: Never Smoker  . Smokeless tobacco: Never Used  Substance and Sexual Activity  . Alcohol use: Yes    Comment: socially  . Drug use: No  . Sexual activity: No

## 2017-09-13 ENCOUNTER — Ambulatory Visit
Admission: RE | Admit: 2017-09-13 | Discharge: 2017-09-13 | Disposition: A | Discharge status: Home / Self Care | Payer: Commercial Managed Care - PPO | Source: Ambulatory Visit | Attending: Orthopedic Surgery | Admitting: Orthopedic Surgery

## 2017-09-13 DIAGNOSIS — G8929 Other chronic pain: Secondary | ICD-10-CM

## 2017-09-13 DIAGNOSIS — M5442 Lumbago with sciatica, left side: Principal | ICD-10-CM

## 2017-09-19 ENCOUNTER — Ambulatory Visit (INDEPENDENT_AMBULATORY_CARE_PROVIDER_SITE_OTHER): Payer: Commercial Managed Care - PPO | Admitting: Orthopedic Surgery

## 2017-09-19 ENCOUNTER — Encounter (INDEPENDENT_AMBULATORY_CARE_PROVIDER_SITE_OTHER): Payer: Self-pay | Admitting: Orthopedic Surgery

## 2017-09-19 DIAGNOSIS — G8929 Other chronic pain: Secondary | ICD-10-CM

## 2017-09-19 DIAGNOSIS — M545 Low back pain, unspecified: Secondary | ICD-10-CM

## 2017-09-19 MED ORDER — MELOXICAM 15 MG PO TABS: 15.0000 mg | ORAL_TABLET | Freq: Every day | ORAL | 0 refills | Status: AC

## 2017-09-21 ENCOUNTER — Encounter (INDEPENDENT_AMBULATORY_CARE_PROVIDER_SITE_OTHER): Payer: Self-pay | Admitting: Orthopedic Surgery

## 2017-09-21 NOTE — Progress Notes (Signed)
   Office Visit Note   Patient: Michael Bennett           Date of Birth: 1991/01/08           MRN: [id number] Visit Date: 09/19/2017 Requested by: No referring provider defined for this encounter. PCP: Patient, No Pcp Per  Subjective: Chief Complaint  Patient presents with  . Lower Back - Follow-up    HPI: Michael Bennett is a patient with low back pain.  Since I have seen him he has had an MRI scan which is normal.  Reports pain in the middle and lower back.  He has not done as much working out.  Describes some numbness and tingling down to the left foot.  He has tried medications but inconsistently.              ROS: All systems reviewed are negative as they relate to the chief complaint within the history of present illness.  Patient denies  fevers or chills.   Assessment & Plan: Visit Diagnoses:  1. Chronic bilateral low back pain without sciatica     Plan: Impression is low back pain with normal MRI scan.  We will try him on Mobic and activity modification.  No other intervention really indicated at this time.  Follow-Up Instructions: Return if symptoms worsen or fail to improve.   Orders:  No orders of the defined types were placed in this encounter.  Meds ordered this encounter  Medications  . meloxicam (MOBIC) 15 MG tablet    Sig: Take 1 tablet (15 mg total) by mouth daily.    Dispense:  30 tablet    Refill:  0      Procedures: No procedures performed   Clinical Data: No additional findings.  Objective: Vital Signs: There were no vitals taken for this visit.  Physical Exam:   Constitutional: Patient appears well-developed HEENT:  Head: Normocephalic Eyes:EOM are normal Neck: Normal range of motion Cardiovascular: Normal rate Pulmonary/chest: Effort normal Neurologic: Patient is alert Skin: Skin is warm Psychiatric: Patient has normal mood and affect    Ortho Exam: Orthopedic exam demonstrates full active and passive range of motion of knees ankles and  hips.  No nerve root tension signs.  No muscle atrophy.  Some pain with forward and lateral bending and no trochanteric tenderness.  Symmetric reflexes.  Negative Babinski negative clonus.  Specialty Comments:  No specialty comments available.  Imaging: No results found.   PMFS History: Patient Active Problem List   Diagnosis Date Noted  . Left hip pain 10/06/2016  . Chest pain radiating to arm 04/01/2012   Past Medical History:  Diagnosis Date  . Allergy   . Irregular heart rate     Family History  Problem Relation Age of Onset  . Heart disease Brother     History reviewed. No pertinent surgical history. Social History   Occupational History  . Not on file  Tobacco Use  . Smoking status: Never Smoker  . Smokeless tobacco: Never Used  Substance and Sexual Activity  . Alcohol use: Yes    Comment: socially  . Drug use: No  . Sexual activity: No

## 2018-11-13 IMAGING — MR MR LUMBAR SPINE W/O CM
4 of 5 series · 18 of 48 positions shown · non-contrast
Comparison: Chest radiographs 03/13/2012.

CLINICAL DATA: 26-year-old male with 1-2 years of left lower back
pain radiating to the left buttock and leg. Left toe numbness.
Persistent symptoms despite conservative treatment.

EXAM:
MRI LUMBAR SPINE WITHOUT CONTRAST
TECHNIQUE: Multiplanar, multisequence MR imaging of the lumbar spine was
performed. No intravenous contrast was administered.

[Series 6: T2 · sagittal · 4.0mm · 0.73mm/px · 6 of 15 slices shown (1 of 2)]
[im 1/15]
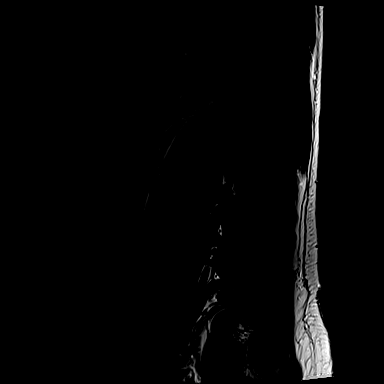
[im 3/15]
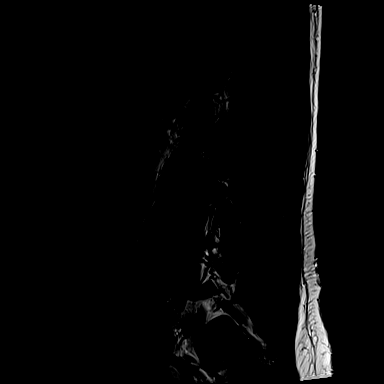
[im 6/15]
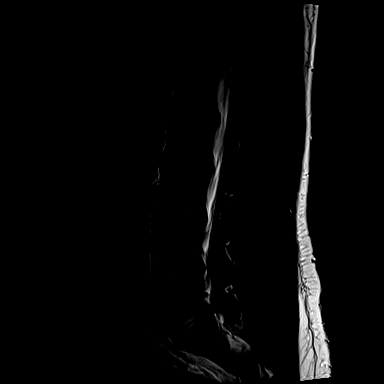
[im 9/15]
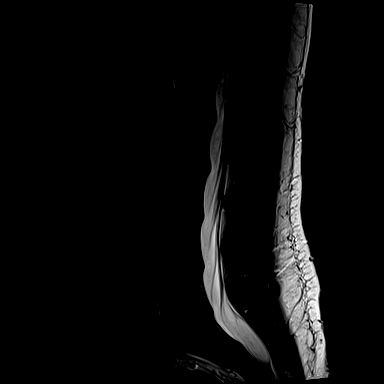
[im 12/15]
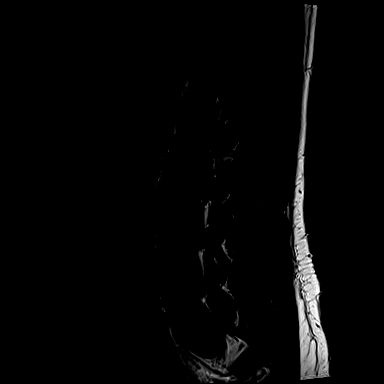
[im 15/15]
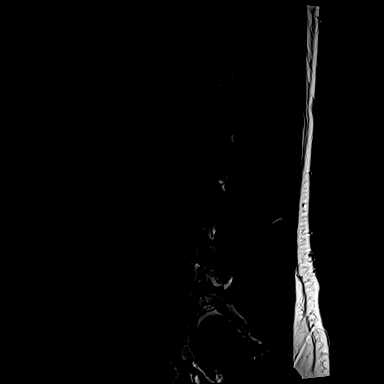

[Series 7: T1 · sagittal · 4.0mm · 0.73mm/px · 3 of 15 slices shown (1 of 2)]
[im 1/15]
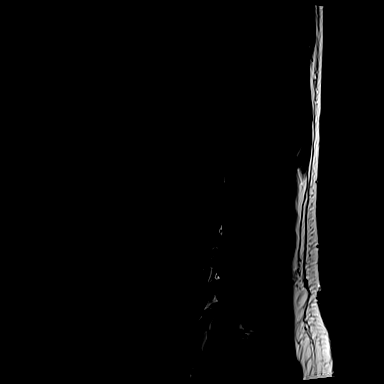
[im 8/15]
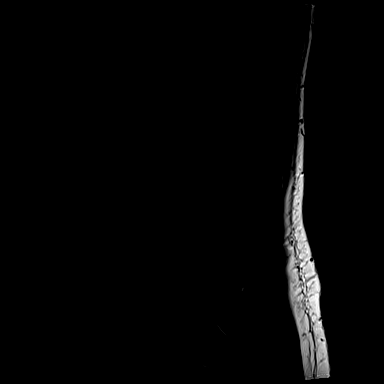
[im 15/15]
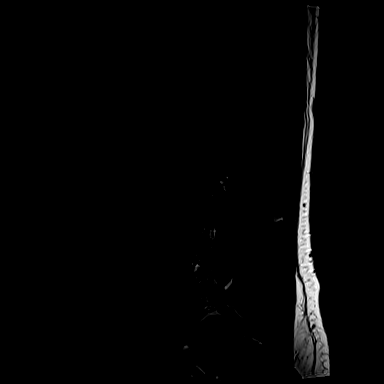

[Series 12: T1 · axial · 4.0mm · 0.28mm/px · z∈[-89,+85]mm · 3 of 46 slices shown (2 of 2)]
[im 7/46]
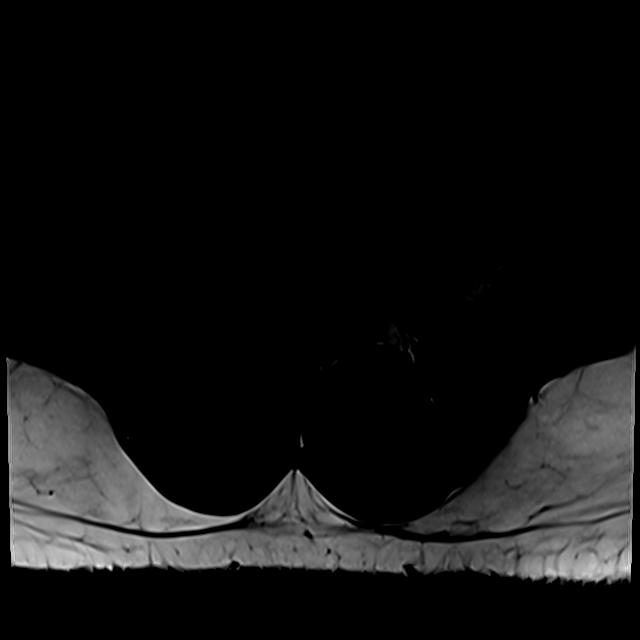
[im 25/46]
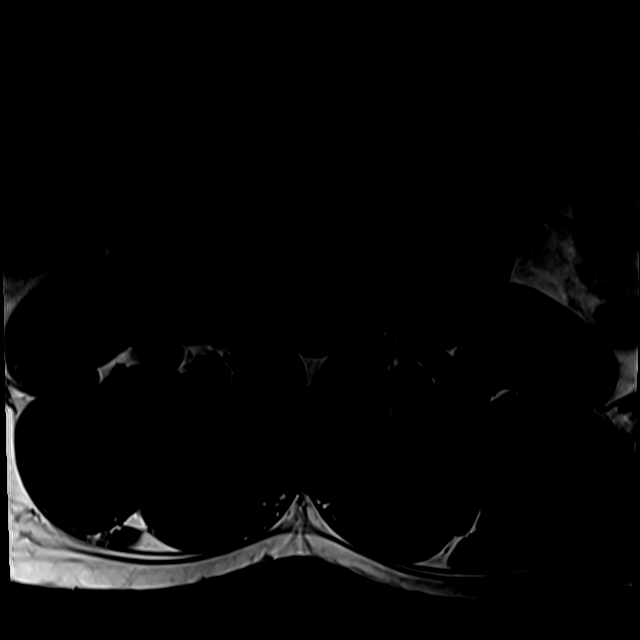
[im 40/46]
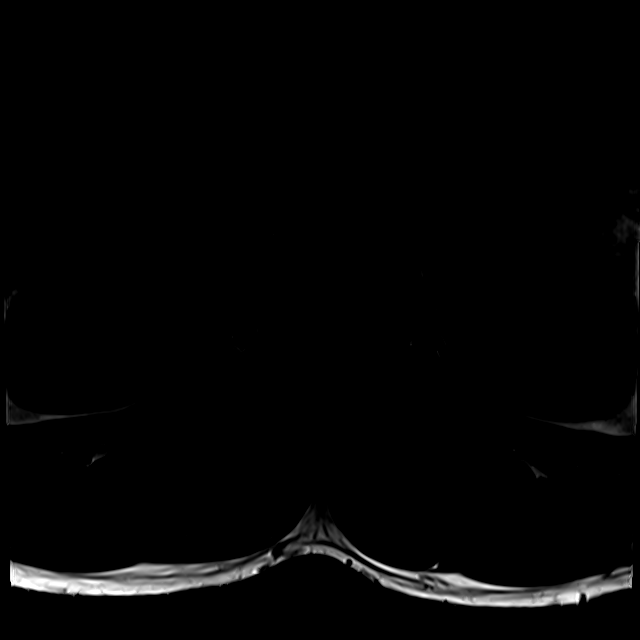

[Series 15: T2 · axial · 4.0mm · 0.28mm/px · z∈[-104,+85]mm · 6 of 46 slices shown (2 of 2)]
[im 4/46]
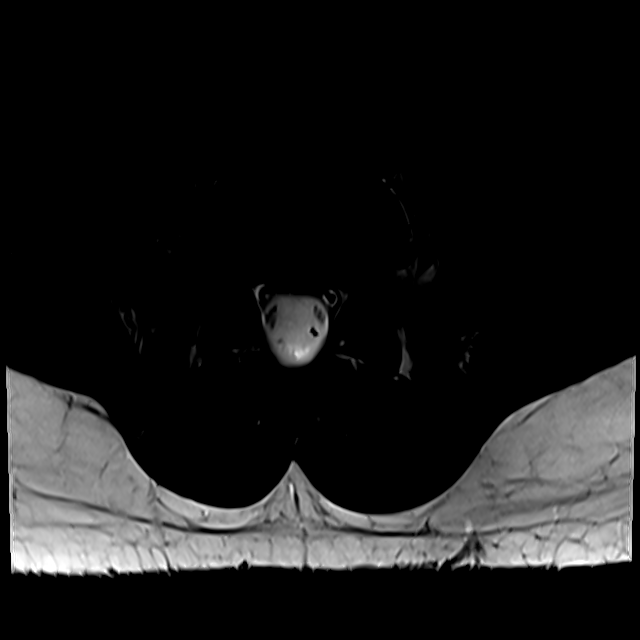
[im 7/46]
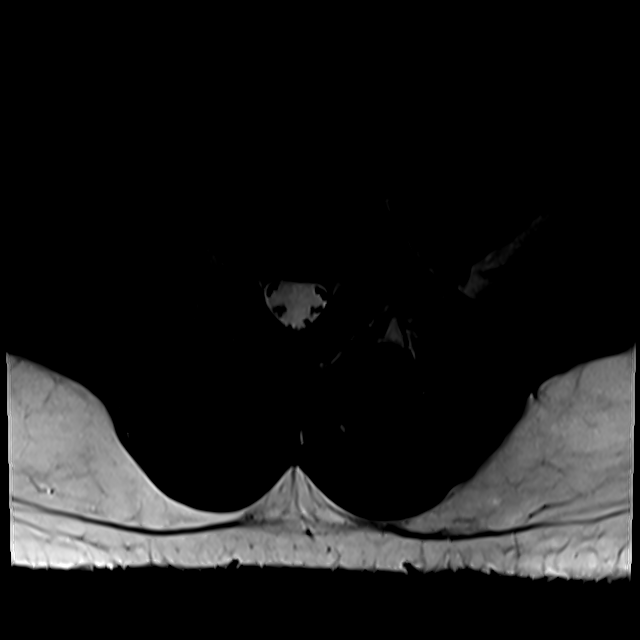
[im 10/46]
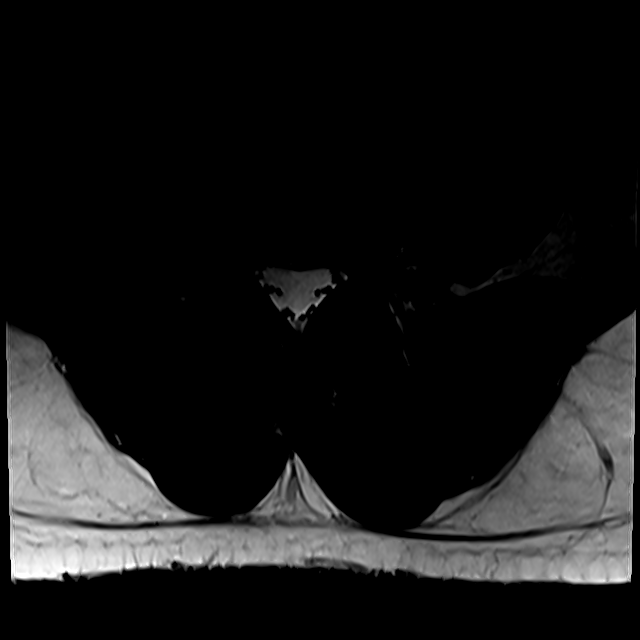
[im 16/46]
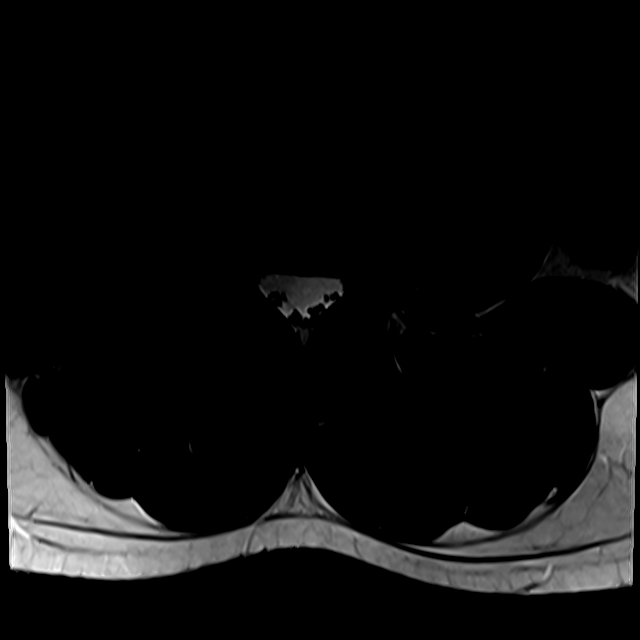
[im 25/46]
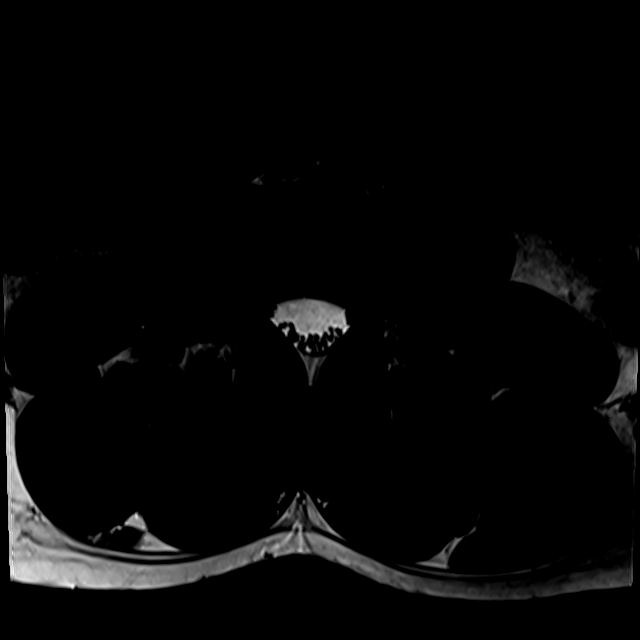
[im 40/46]
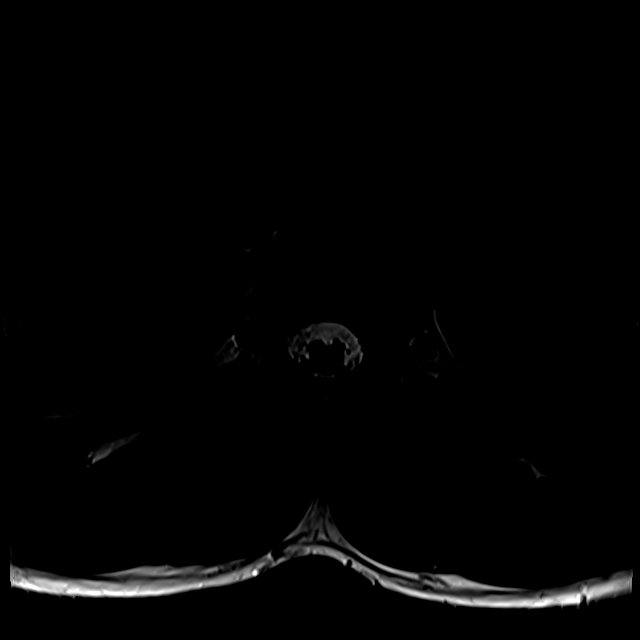

[18 of 48 positions shown; findings below may reference images not displayed]

FINDINGS: Segmentation: Normal thoracic and lumbar segmentation judging from
the comparison and the current images.

Alignment: Straightening of lumbar lordosis with no
spondylolisthesis or scoliosis.

Vertebrae: Visualized bone marrow signal is within normal limits. No
marrow edema or evidence of acute osseous abnormality. Intact
visible sacrum and SI joints.

Conus medullaris and cauda equina: Conus extends to the L1-L2 level.
Conus and cauda equina appear normal.

Paraspinal and other soft tissues: Negative.

Disc levels:

The visible intervertebral discs demonstrate normal signal and
morphology.

T11-T12: Negative.

T12-L1:  Negative.

L1-L2:  Negative.

L2-L3:  Negative.

L3-L4:  Negative.

L4-L5:  Negative.

L5-S1:  Negative.
IMPRESSION: Normal MRI appearance of the lumbar spine. No osseous abnormality,
disc herniation, or neural impingement identified

## 2020-03-20 ENCOUNTER — Emergency Department (HOSPITAL_COMMUNITY)
Admission: EM | Admit: 2020-03-20 | Discharge: 2020-03-20 | Disposition: A | Discharge status: Home / Self Care | Payer: Managed Care, Other (non HMO) | Attending: Emergency Medicine | Admitting: Emergency Medicine

## 2020-03-20 ENCOUNTER — Other Ambulatory Visit: Payer: Self-pay

## 2020-03-20 ENCOUNTER — Encounter (HOSPITAL_COMMUNITY): Payer: Self-pay | Admitting: Emergency Medicine

## 2020-03-20 ENCOUNTER — Emergency Department (HOSPITAL_COMMUNITY): Payer: Managed Care, Other (non HMO)

## 2020-03-20 DIAGNOSIS — W25XXXA Contact with sharp glass, initial encounter: Secondary | ICD-10-CM | POA: Insufficient documentation

## 2020-03-20 DIAGNOSIS — Y929 Unspecified place or not applicable: Secondary | ICD-10-CM | POA: Diagnosis not present

## 2020-03-20 DIAGNOSIS — Y939 Activity, unspecified: Secondary | ICD-10-CM | POA: Insufficient documentation

## 2020-03-20 DIAGNOSIS — Z23 Encounter for immunization: Secondary | ICD-10-CM | POA: Insufficient documentation

## 2020-03-20 DIAGNOSIS — Y999 Unspecified external cause status: Secondary | ICD-10-CM | POA: Insufficient documentation

## 2020-03-20 DIAGNOSIS — S61211A Laceration without foreign body of left index finger without damage to nail, initial encounter: Secondary | ICD-10-CM | POA: Diagnosis not present

## 2020-03-20 DIAGNOSIS — S60922A Unspecified superficial injury of left hand, initial encounter: Secondary | ICD-10-CM | POA: Diagnosis present

## 2020-03-20 DIAGNOSIS — W28XXXA Contact with powered lawn mower, initial encounter: Secondary | ICD-10-CM | POA: Insufficient documentation

## 2020-03-20 DIAGNOSIS — S51012A Laceration without foreign body of left elbow, initial encounter: Secondary | ICD-10-CM | POA: Diagnosis not present

## 2020-03-20 MED ORDER — LIDOCAINE-EPINEPHRINE 1 %-1:100000 IJ SOLN
10.0000 mL | Freq: Once | INTRAMUSCULAR | Status: AC
  Administered 2020-03-20: 10 mL via INTRADERMAL
  Filled 2020-03-20: qty 1

## 2020-03-20 MED ORDER — TETANUS-DIPHTH-ACELL PERTUSSIS 5-2.5-18.5 LF-MCG/0.5 IM SUSP
0.5000 mL | Freq: Once | INTRAMUSCULAR | Status: AC
  Administered 2020-03-20: 0.5 mL via INTRAMUSCULAR
  Filled 2020-03-20: qty 0.5

## 2020-03-20 MED ORDER — CEPHALEXIN 500 MG PO CAPS: 500.0000 mg | ORAL_CAPSULE | Freq: Four times a day (QID) | ORAL | 0 refills | Status: AC
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
## 2020-03-20 NOTE — ED Notes (Signed)
Emergency MD at bedside for suture
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
## 2020-03-20 NOTE — ED Provider Notes (Signed)
LACERATION REPAIR Performed by: Audree Camel Authorized by: Audree Camel Consent: Verbal consent obtained. Risks and benefits: risks, benefits and alternatives were discussed Consent given by: patient Patient identity confirmed: provided demographic data Prepped and Draped in normal sterile fashion Wound explored  Laceration Location: left medial elbow  Laceration Length: 1cm  No Foreign Bodies seen or palpated  Anesthesia: local infiltration  Local anesthetic: lidocaine 2% with epinephrine  Irrigation method: syringe Amount of cleaning: standard  Skin closure: 4-0 prolene  Number of sutures: 2  Technique: Simple interrupted  Patient tolerance: Patient tolerated the procedure well with no immediate complications.  LACERATION REPAIR Performed by: Audree Camel Authorized by: Audree Camel Consent: Verbal consent obtained. Risks and benefits: risks, benefits and alternatives were discussed Consent given by: patient Patient identity confirmed: provided demographic data Prepped and Draped in normal sterile fashion Wound explored  Laceration Location: left index finger  Laceration Length: 0.5cm  No Foreign Bodies seen or palpated  Anesthesia: digital block  Irrigation method: syringe Amount of cleaning: standard  Skin closure: 4-0 prolene  Number of sutures: 1  Technique: simple interrupted  Patient tolerance: Patient tolerated the procedure well with no immediate complications.     Pricilla Loveless, MD 03/20/20 2326
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
## 2020-03-20 NOTE — Discharge Instructions (Signed)
If you have numbness that persists in your finger, follow-up with a hand specialist next week.  Otherwise return for your stitches to be removed.

## 2020-03-20 NOTE — ED Notes (Signed)
Patient verbalizes understanding of discharge instructions. Opportunity for questioning and answers were provided. Armband removed by staff, pt discharged from ED stable & ambulatory
